# Patient Record
Sex: Male | Born: 1965 | Race: White | Hispanic: No
Health system: Southern US, Community
[De-identification: ages and names within clinical notes are randomized; demographics above are authoritative.]

## PROBLEM LIST (undated history)

## (undated) DIAGNOSIS — I10 Essential (primary) hypertension: Secondary | ICD-10-CM

---

## 2018-05-23 ENCOUNTER — Emergency Department
Admission: EM | Admit: 2018-05-23 | Discharge: 2018-05-23 | Disposition: A | Discharge status: Home / Self Care | Payer: BLUE CROSS/BLUE SHIELD | Attending: Emergency Medicine | Admitting: Emergency Medicine

## 2018-05-23 ENCOUNTER — Encounter: Payer: Self-pay | Admitting: Medical Oncology

## 2018-05-23 ENCOUNTER — Emergency Department: Payer: BLUE CROSS/BLUE SHIELD

## 2018-05-23 DIAGNOSIS — Y9389 Activity, other specified: Secondary | ICD-10-CM | POA: Diagnosis not present

## 2018-05-23 DIAGNOSIS — Y999 Unspecified external cause status: Secondary | ICD-10-CM | POA: Diagnosis not present

## 2018-05-23 DIAGNOSIS — S199XXA Unspecified injury of neck, initial encounter: Secondary | ICD-10-CM | POA: Diagnosis present

## 2018-05-23 DIAGNOSIS — S161XXA Strain of muscle, fascia and tendon at neck level, initial encounter: Secondary | ICD-10-CM | POA: Diagnosis not present

## 2018-05-23 DIAGNOSIS — I10 Essential (primary) hypertension: Secondary | ICD-10-CM | POA: Diagnosis not present

## 2018-05-23 DIAGNOSIS — M7918 Myalgia, other site: Secondary | ICD-10-CM

## 2018-05-23 DIAGNOSIS — Y92410 Unspecified street and highway as the place of occurrence of the external cause: Secondary | ICD-10-CM | POA: Insufficient documentation

## 2018-05-23 HISTORY — DX: Essential (primary) hypertension: I10

## 2018-05-23 MED ORDER — TRAMADOL HCL 50 MG PO TABS
50.0000 mg | ORAL_TABLET | Freq: Once | ORAL | Status: AC
  Administered 2018-05-23: 50 mg via ORAL
  Filled 2018-05-23: qty 1

## 2018-05-23 MED ORDER — TRAMADOL HCL 50 MG PO TABS: 50.0000 mg | ORAL_TABLET | Freq: Two times a day (BID) | ORAL | 0 refills | Status: AC | PRN

## 2018-05-23 MED ORDER — IBUPROFEN 600 MG PO TABS
600.0000 mg | ORAL_TABLET | Freq: Once | ORAL | Status: AC
  Administered 2018-05-23: 600 mg via ORAL
  Filled 2018-05-23: qty 1

## 2018-05-23 MED ORDER — CYCLOBENZAPRINE HCL 10 MG PO TABS: 10.0000 mg | ORAL_TABLET | Freq: Three times a day (TID) | ORAL | 0 refills | Status: AC | PRN

## 2018-05-23 MED ORDER — CYCLOBENZAPRINE HCL 10 MG PO TABS: 10.0000 mg | ORAL_TABLET | Freq: Three times a day (TID) | ORAL | 0 refills | Status: DC | PRN

## 2018-05-23 MED ORDER — IBUPROFEN 600 MG PO TABS: 600.0000 mg | ORAL_TABLET | Freq: Three times a day (TID) | ORAL | 0 refills | Status: DC | PRN

## 2018-05-23 MED ORDER — TRAMADOL HCL 50 MG PO TABS: 50.0000 mg | ORAL_TABLET | Freq: Two times a day (BID) | ORAL | 0 refills | Status: DC | PRN

## 2018-05-23 MED ORDER — IBUPROFEN 600 MG PO TABS: 600.0000 mg | ORAL_TABLET | Freq: Three times a day (TID) | ORAL | 0 refills | Status: AC | PRN

## 2018-05-23 MED ORDER — CYCLOBENZAPRINE HCL 10 MG PO TABS
10.0000 mg | ORAL_TABLET | Freq: Once | ORAL | Status: AC
  Administered 2018-05-23: 10 mg via ORAL
  Filled 2018-05-23: qty 1
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
## 2018-05-23 NOTE — Discharge Instructions (Signed)
Follow discharge care instruction take medication as directed. °
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
## 2023-01-06 ENCOUNTER — Ambulatory Visit
Admit: 2023-01-06 | Discharge: 2023-01-06 | Payer: BLUE CROSS/BLUE SHIELD | Attending: Physician Assistant | Primary: Physician Assistant

## 2023-01-06 DIAGNOSIS — I10 Essential (primary) hypertension: Secondary | ICD-10-CM

## 2023-01-06 MED ORDER — CLONIDINE HCL 0.1 MG PO TABS
0.1 MG | ORAL_TABLET | Freq: Two times a day (BID) | ORAL | 1 refills | Status: AC
Start: 2023-01-06 — End: ?

## 2023-01-06 MED ORDER — AMLODIPINE BESYLATE 10 MG PO TABS
10 MG | ORAL_TABLET | Freq: Every day | ORAL | 1 refills | Status: AC
Start: 2023-01-06 — End: ?

## 2023-01-06 MED ORDER — LOSARTAN POTASSIUM 100 MG PO TABS
100 MG | ORAL_TABLET | Freq: Every day | ORAL | 1 refills | Status: AC
Start: 2023-01-06 — End: ?

## 2023-01-06 NOTE — Progress Notes (Signed)
Lawrence Garza (DOB:  Feb 13, 1966) is a 57 y.o. male here for evaluation of the following chief complaint(s):  New Patient (Patient presents for a new patient appointment, pt states he will need refills on his medications, pt was discharged from previous PCP due to not going to see them, he got his last refills from urgent care and since he needed to establish care with PCP to get more refills) and Hypertension      Assessment & Plan   ASSESSMENT/PLAN:  1. Essential hypertension  Comments:  Controlled on current regimen. refills sent. REviewed labs from [facility], pt will defer lipid panel, screening a1c, and PSA until next visit.  Orders:  -     amLODIPine (NORVASC) 10 MG tablet; Take 1 tablet by mouth daily, Disp-90 tablet, R-1Normal  -     cloNIDine (CATAPRES) 0.1 MG tablet; Take 1 tablet by mouth 2 times daily, Disp-180 tablet, R-1Normal  -     losartan (COZAAR) 100 MG tablet; Take 1 tablet by mouth daily, Disp-90 tablet, R-1Normal  2. Elevated hemoglobin (HCC)  Comments:  saw hematology in [place] years ago, declines referral at this time as they already did extensive workup and told him he only needs therapeutic phlebotomy              Which [facility]     No results found for any visits on 01/06/23.     Return in about 6 months (around 07/08/2023).         Subjective   SUBJECTIVE/OBJECTIVE:  New patient here today to establish care.     He has HTN, controlled on amlodipine + losartan and clonidine. He last got refills from [facility] but is needing more because that was only a 30 day supply. He denies headaches, dizziness, blurred vision, chest pains, SOB or extremity edema. His last PCP was in [place] but he missed too many appointments with them. He believes it has been about a year and a half since his las physical, but declines blood work today. He says he would like to do it at his next visit. When he went to [facility] for refills they did a CBC and BMP and everything was  normal except Hemoglobin was 18.   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 He says he had extensive work up but was eventually told everything was normal and he would benefit from therapeutic phlebotomy every so often. He has not had this done in a while        Allergies   Allergen Reactions    Bee Venom Anaphylaxis and Swelling     Current Outpatient Medications   Medication Sig Dispense Refill    albuterol sulfate HFA (PROVENTIL;VENTOLIN;PROAIR) 108 (90 Base) MCG/ACT inhaler       aspirin 81 MG EC tablet Take 1 tablet by mouth daily      omeprazole (PRILOSEC) 10 MG delayed release capsule Take 1 capsule by mouth daily      amLODIPine (NORVASC) 10 MG tablet Take 1 tablet by mouth daily 90 tablet 1    cloNIDine (CATAPRES) 0.1 MG tablet Take 1 tablet by mouth 2 times daily 180 tablet 1    losartan (COZAAR) 100 MG tablet Take 1 tablet by mouth daily 90 tablet 1     No current facility-administered medications for this visit.       History reviewed. No pertinent past medical history.  History reviewed. No pertinent surgical history.  Social History     Tobacco Use    Smoking status: Every Day     Current packs/day: 1.00     Average packs/day: 1 pack/day for 39.4 years (39.4 ttl pk-yrs)     Types: Cigarettes     Start date: 07/30/1983    Smokeless tobacco: Never   Vaping Use    Vaping Use: Never used      History reviewed. No pertinent family history.    Review of Systems   Constitutional:  Negative for chills, fatigue, fever and unexpected weight change.   HENT:  Negative for congestion and sore throat.    Eyes:  Negative for pain.   Respiratory:  Negative for shortness of breath.    Cardiovascular:  Negative for chest pain, palpitations and leg swelling.   Gastrointestinal:  Negative for abdominal pain, constipation, diarrhea and vomiting.   Endocrine: Negative for polydipsia, polyphagia and polyuria.   Genitourinary:  Negative for dysuria.   Musculoskeletal:  Negative for arthralgias,  back pain, gait problem, joint swelling, myalgias, neck pain and neck stiffness.   Skin:  Negative for rash.   Allergic/Immunologic: Negative for immunocompromised state.   Neurological:  Negative for dizziness, weakness and headaches.   Psychiatric/Behavioral:  Negative for agitation, behavioral problems, sleep disturbance and suicidal ideas.               Objective     Vitals:    01/06/23 0954   BP: 136/77   Pulse: 81   Resp:    Temp:    SpO2:      Vitals:    01/06/23 0928 01/06/23 0954   BP: (!) 140/91 136/77   Site: Right Upper Arm Left Upper Arm   Position: Sitting Sitting   Cuff Size: Medium Adult Medium Adult   Pulse: 87 81   Resp: 18    Temp: 98.6 F (37 C)    SpO2: 96%    Weight: 72.1 kg (159 lb)    Height: 1.753 m (5\' 9" )       Physical Exam  Vitals reviewed.   Constitutional:       Appearance: Normal appearance.   HENT:      Head: Normocephalic and atraumatic.   Eyes:      Extraocular Movements: Extraocular movements intact.      Conjunctiva/sclera: Conjunctivae normal.      Pupils: Pupils are equal, round, and reactive to light.   Cardiovascular:      Rate and Rhythm: Normal rate and regular rhythm.   Pulmonary:      Effort: Pulmonary effort is normal.      Breath sounds: Normal breath sounds.   Abdominal:      General: Bowel sounds are normal.      Palpations: Abdomen is soft.   Musculoskeletal:         General: Normal range of motion.      Cervical back: Normal range of motion and neck supple.   Skin:     General: Skin is warm and dry.   Neurological:      General: No focal deficit present.      Mental Status: He is alert and oriented to person, place, and time.   Psychiatric:         Mood and Affect: Mood normal.         Behavior: Behavior normal.         Judgment: Judgment normal.  An electronic signature was used to authenticate this note.    --Leonidas Romberg, PA

## 2023-07-09 ENCOUNTER — Encounter: Admit: 2023-07-09 | Payer: BLUE CROSS/BLUE SHIELD | Admitting: Physician Assistant | Primary: Physician Assistant

## 2023-07-09 ENCOUNTER — Encounter: Admit: 2023-07-09

## 2023-07-09 VITALS — BP 131/83 | HR 87 | Temp 98.30000°F | Resp 18 | Ht 69.0 in | Wt 155.0 lb

## 2023-07-09 DIAGNOSIS — I10 Essential (primary) hypertension: Principal | ICD-10-CM

## 2023-07-09 DIAGNOSIS — Z125 Encounter for screening for malignant neoplasm of prostate: Secondary | ICD-10-CM

## 2023-07-09 LAB — CBC WITH AUTO DIFFERENTIAL
Basophils %: 2 % (ref 0.0–2.0)
Basophils Absolute: 0.1 10*3/uL (ref 0.0–0.2)
Eosinophils %: 2 % (ref 0.5–7.8)
Eosinophils Absolute: 0.1 10*3/uL (ref 0.0–0.8)
Hematocrit: 54.2 % — ABNORMAL HIGH (ref 41.1–50.3)
Hemoglobin: 17.7 g/dL — ABNORMAL HIGH (ref 13.6–17.2)
Immature Granulocytes %: 0 % (ref 0.0–5.0)
Immature Granulocytes Absolute: 0 10*3/uL (ref 0.0–0.5)
Lymphocytes %: 21 % (ref 13–44)
Lymphocytes Absolute: 1.5 10*3/uL (ref 0.5–4.6)
MCH: 34 pg — ABNORMAL HIGH (ref 26.1–32.9)
MCHC: 32.7 g/dL (ref 31.4–35.0)
MCV: 104 FL — ABNORMAL HIGH (ref 82–102)
MPV: 9.2 FL — ABNORMAL LOW (ref 9.4–12.3)
Monocytes %: 8 % (ref 4.0–12.0)
Monocytes Absolute: 0.6 10*3/uL (ref 0.1–1.3)
Neutrophils %: 67 % (ref 43–78)
Neutrophils Absolute: 5 10*3/uL (ref 1.7–8.2)
Platelets: 229 10*3/uL (ref 150–450)
RBC: 5.21 M/uL (ref 4.23–5.6)
RDW: 14.2 % (ref 11.9–14.6)
WBC: 7.3 10*3/uL (ref 4.3–11.1)
nRBC: 0 10*3/uL (ref 0.0–0.2)

## 2023-07-09 LAB — AMB POC URINALYSIS DIP STICK AUTO W/O MICRO
Bilirubin, Urine, POC: NEGATIVE
Blood, Urine, POC: NEGATIVE
Glucose, Urine, POC: NEGATIVE
Ketones, Urine, POC: NEGATIVE
Leukocyte Esterase, Urine, POC: NEGATIVE
Nitrite, Urine, POC: NEGATIVE
Protein, Urine, POC: NEGATIVE
Specific Gravity, Urine, POC: 1.01 (ref 1.001–1.035)
Urobilinogen, POC: NORMAL
pH, Urine, POC: 6 (ref 4.6–8.0)

## 2023-07-09 MED ORDER — LOSARTAN POTASSIUM 100 MG PO TABS
100 | ORAL_TABLET | Freq: Every day | ORAL | 1 refills | Status: AC
Start: 2023-07-09 — End: ?

## 2023-07-09 MED ORDER — CLONIDINE HCL 0.1 MG PO TABS
0.1 | ORAL_TABLET | Freq: Two times a day (BID) | ORAL | 1 refills | Status: AC
Start: 2023-07-09 — End: ?

## 2023-07-09 MED ORDER — AMLODIPINE BESYLATE 10 MG PO TABS
10 | ORAL_TABLET | Freq: Every day | ORAL | 1 refills | Status: AC
Start: 2023-07-09 — End: ?

## 2023-07-09 MED ORDER — ALBUTEROL SULFATE HFA 108 (90 BASE) MCG/ACT IN AERS
108 | Freq: Four times a day (QID) | RESPIRATORY_TRACT | 2 refills | Status: AC | PRN
Start: 2023-07-09 — End: ?

## 2023-07-09 NOTE — Progress Notes (Signed)
 Lawrence Garza (DOB:  Jul 04, 1966) is a 57 y.o. male here for evaluation of the following chief complaint(s):  Hypertension (Patient presents for a follow up on HTN, states he's taking all his medication at home. Patient checks his BP at home periodicall

## 2023-07-10 LAB — LIPID PANEL
Chol/HDL Ratio: 2 (ref 0.0–5.0)
Cholesterol, Total: 211 mg/dL — ABNORMAL HIGH (ref 0–200)
HDL: 103 mg/dL — ABNORMAL HIGH (ref 40–60)
LDL Cholesterol: 98 mg/dL (ref 0–100)
Triglycerides: 51 mg/dL (ref 0–150)
VLDL Cholesterol Calculated: 10 mg/dL (ref 6–23)

## 2023-07-10 LAB — COMPREHENSIVE METABOLIC PANEL
ALT: 13 U/L (ref 8–55)
AST: 23 U/L (ref 15–37)
Albumin/Globulin Ratio: 1.3 (ref 1.0–1.9)
Albumin: 4 g/dL (ref 3.5–5.0)
Alk Phosphatase: 54 U/L (ref 40–129)
Anion Gap: 12 mmol/L (ref 7–16)
BUN: 9 mg/dL (ref 6–23)
CO2: 25 mmol/L (ref 20–29)
Calcium: 9.4 mg/dL (ref 8.8–10.2)
Chloride: 100 mmol/L (ref 98–107)
Creatinine: 0.67 mg/dL — ABNORMAL LOW (ref 0.80–1.30)
Est, Glom Filt Rate: >90 mL/min/{1.73_m2} (ref >60)
Globulin: 3 g/dL (ref 2.3–3.5)
Glucose: 91 mg/dL (ref 70–99)
Potassium: 4.6 mmol/L (ref 3.5–5.1)
Sodium: 137 mmol/L (ref 136–145)
Total Bilirubin: 0.6 mg/dL (ref 0.0–1.2)
Total Protein: 7 g/dL (ref 6.3–8.2)

## 2023-07-10 LAB — PSA SCREENING: PSA: 1.6 ng/mL (ref 0.0–4.0)
